# Patient Record
Sex: Female | Born: 1935 | Race: White | Hispanic: No | Smoking: Never smoker
Health system: Southern US, Community
[De-identification: ages and names within clinical notes are randomized; demographics above are authoritative.]

## PROBLEM LIST (undated history)

## (undated) DIAGNOSIS — E7800 Pure hypercholesterolemia, unspecified: Secondary | ICD-10-CM

## (undated) DIAGNOSIS — M5481 Occipital neuralgia: Secondary | ICD-10-CM

## (undated) DIAGNOSIS — K635 Polyp of colon: Secondary | ICD-10-CM

## (undated) DIAGNOSIS — M1990 Unspecified osteoarthritis, unspecified site: Secondary | ICD-10-CM

## (undated) DIAGNOSIS — E559 Vitamin D deficiency, unspecified: Secondary | ICD-10-CM

## (undated) DIAGNOSIS — I10 Essential (primary) hypertension: Secondary | ICD-10-CM

## (undated) HISTORY — DX: Occipital neuralgia: M54.81

## (undated) HISTORY — DX: Unspecified osteoarthritis, unspecified site: M19.90

## (undated) HISTORY — DX: Vitamin D deficiency, unspecified: E55.9

## (undated) HISTORY — DX: Essential (primary) hypertension: I10

## (undated) HISTORY — PX: OTHER SURGICAL HISTORY: SHX169

## (undated) HISTORY — PX: DILATION AND CURETTAGE OF UTERUS: SHX78

## (undated) HISTORY — DX: Pure hypercholesterolemia, unspecified: E78.00

## (undated) HISTORY — DX: Polyp of colon: K63.5

---

## 1998-03-09 ENCOUNTER — Ambulatory Visit (HOSPITAL_COMMUNITY): Admission: RE | Admit: 1998-03-09 | Discharge: 1998-03-09 | Payer: Self-pay | Admitting: Geriatric Medicine

## 2002-08-18 ENCOUNTER — Encounter: Payer: Self-pay | Admitting: Geriatric Medicine

## 2002-08-18 ENCOUNTER — Ambulatory Visit (HOSPITAL_COMMUNITY): Admission: RE | Admit: 2002-08-18 | Discharge: 2002-08-18 | Payer: Self-pay | Admitting: Geriatric Medicine

## 2002-08-27 ENCOUNTER — Ambulatory Visit (HOSPITAL_BASED_OUTPATIENT_CLINIC_OR_DEPARTMENT_OTHER): Admission: RE | Admit: 2002-08-27 | Discharge: 2002-08-27 | Payer: Self-pay | Admitting: Surgery

## 2002-08-27 ENCOUNTER — Encounter (INDEPENDENT_AMBULATORY_CARE_PROVIDER_SITE_OTHER): Payer: Self-pay | Admitting: *Deleted

## 2003-10-17 ENCOUNTER — Ambulatory Visit (HOSPITAL_COMMUNITY): Admission: RE | Admit: 2003-10-17 | Discharge: 2003-10-17 | Payer: Self-pay | Admitting: Neurosurgery

## 2003-11-19 ENCOUNTER — Encounter: Admission: RE | Admit: 2003-11-19 | Discharge: 2004-01-14 | Payer: Self-pay | Admitting: Otolaryngology

## 2004-05-18 ENCOUNTER — Ambulatory Visit (HOSPITAL_COMMUNITY): Admission: RE | Admit: 2004-05-18 | Discharge: 2004-05-18 | Payer: Self-pay | Admitting: Gastroenterology

## 2004-05-18 ENCOUNTER — Encounter (INDEPENDENT_AMBULATORY_CARE_PROVIDER_SITE_OTHER): Payer: Self-pay | Admitting: Specialist

## 2004-11-16 ENCOUNTER — Ambulatory Visit (HOSPITAL_COMMUNITY): Admission: RE | Admit: 2004-11-16 | Discharge: 2004-11-16 | Payer: Self-pay | Admitting: Neurosurgery

## 2005-11-23 ENCOUNTER — Ambulatory Visit (HOSPITAL_COMMUNITY): Admission: RE | Admit: 2005-11-23 | Discharge: 2005-11-23 | Payer: Self-pay | Admitting: Neurosurgery

## 2006-12-03 ENCOUNTER — Ambulatory Visit (HOSPITAL_COMMUNITY): Admission: RE | Admit: 2006-12-03 | Discharge: 2006-12-03 | Payer: Self-pay | Admitting: Neurosurgery

## 2007-11-21 ENCOUNTER — Encounter: Admission: RE | Admit: 2007-11-21 | Discharge: 2007-11-21 | Payer: Self-pay | Admitting: Neurosurgery

## 2010-05-08 ENCOUNTER — Encounter: Payer: Self-pay | Admitting: Neurosurgery

## 2010-09-02 NOTE — Op Note (Signed)
NAMEVERONIA, Marquez NO.:  [id number]   MEDICAL RECORD NO.:  [id number]          PATIENT TYPE:  AMB   LOCATION:  ENDO                         FACILITY:  [facility]   PHYSICIAN:  Danise Edge, M.D.   DATE OF BIRTH:  1931-08-24   DATE OF PROCEDURE:  05/18/2004  DATE OF DISCHARGE:                                 OPERATIVE REPORT   PROCEDURES:  1.  Colonoscopy.  2.  Polypectomy.   PROCEDURE INDICATION:  Ms. [name] is a 77 year old female born on  03-Aug-1931.  Ms. [name] is scheduled to undergo a screening  colonoscopy with polypectomy to prevent colon cancer.   ENDOSCOPIST:  Danise Edge, M.D.   PREMEDICATION:  Versed 4 mg and Demerol 70 mg.   DESCRIPTION OF PROCEDURE:  After obtaining informed consent, Ms. Risk was  placed in the left lateral decubitus position.  I administered intravenous  Demerol and intravenous Versed to achieve conscious sedation for the  procedure.  The patient's blood pressure, oxygen saturation and cardiac  rhythm were monitored throughout the procedure and documented in the medical  record.   Anal inspection and digital rectal exam were normal.  The Olympus adjustable  pediatric colonoscope was introduced into the rectum and advanced to the  cecum.  Colonic preparation for the exam today was excellent.   Rectum:  Normal.   Sigmoid colon and descending:  At 45 cm from the anal verge a 2 mm sessile  polyp was lifted by submucosal saline injection and removed with the  electrocautery snare.   Splenic flexure:  Normal.   Transverse colon:  Normal.   Hepatic flexure:  Normal.   Ascending colon:  Normal.   Cecum and ileocecal valve:  Normal.   ASSESSMENT:  A small polyp was removed from the sigmoid colon at 45 cm from  the anal verge; otherwise normal proctocolonoscopy to the cecum.      MJ/MEDQ  D:  05/18/2004  T:  05/18/2004  Job:  [id number]   cc:   Hal T. Stoneking, M.D.  [address] [place], [place] [address]  Fax: [phone]

## 2010-09-02 NOTE — Op Note (Signed)
NAME:  HOLLIS, OH                       ACCOUNT NO.:  [id number]   MEDICAL RECORD NO.:  [id number]                   PATIENT TYPE:  OUT   LOCATION:  XRAY                                 FACILITY:  [facility]   PHYSICIAN:  Currie Paris, M.D.           DATE OF BIRTH:  1931/08/08   DATE OF PROCEDURE:  08/27/2002  DATE OF DISCHARGE:  08/18/2002                                 OPERATIVE REPORT   OFFICE MEDICAL RECORD NUMBER:  [id number]   PREOPERATIVE DIAGNOSIS:  Calcifications left breast with atypical  hyperplasia on core biopsy.   POSTOPERATIVE DIAGNOSIS:  Calcifications left breast with atypical  hyperplasia on core biopsy.   OPERATION:  Needle guided excision left breast calcifications.   SURGEON:  Currie Paris, M.D.   ANESTHESIA:  MAC.   CLINICAL HISTORY:  This patient is a 77 year old with some calcifications  seen and core biopsy having been done showing some atypical hyperplasia and  CAPPS.  It was decided to do a wider excision to rule out an underlying or  adjacent malignancy.   DESCRIPTION OF PROCEDURE:  The patient seen in the holding area and had no  further questions.  Dr. Isabell Jarvis had already placed a guide wire.  The guide  wire entered superiorly and tracked inferiorly and laterally.   The patient was taken to the operating room and after satisfactory IV  sedation, the breast was prepped and draped.  I injected 1% Xylocaine with  0.5% Marcaine for local.  The guide wire was retracted somewhat inferiorly,  so I made an incision about 1.5 cm below the entry site of the guide wire  which was likewise below the core biopsy entry site.  I divided about 1 cm  of breast and subcutaneous tissue and then identified the guide wire.  Using  retractors and cauterizes, I started coming around the guide wire going  posterior to it basically straight down towards the breast and around both  medially and laterally and then once I had it loosened up I was able  to  grasp the tissue around it and continuing incision going inferiorly and the  axillary tip of the guide wire and the area in question was fairly close to  the nipple areolar complex and I in fact got under the nipple somewhat in  taking this tissue out.  I did see what looked like either an old biopsy  cavity or perhaps an old chronically inflamed cyst which I marked with  suture.   Once the specimen was out, it was sent for mammogram and Dr. Isabell Jarvis  reported that it appeared that we had the area well encompassed and  apparently in the middle of the specimen.   While waiting for that, we went ahead irrigated, put in more local to make  sure there was good pain relief and used the cautery to make sure we had  complete hemostasis.  Once everything appeared to be  dry we closed the  subcutaneous with some 3-0 Vicryl and the skin with 4-0 Monocryl leaving a  little bit of the cavity since thought that trying to close anything deeper  would cause significant breast deformity.   The patient tolerated the procedure well.  There were no operative  complications.  All counts were correct.                                               Currie Paris, M.D.    CJS/MEDQ  D:  08/27/2002  T:  08/28/2002  Job:  [id number]   cc:   Hal T. Stoneking, M.D.  [address]  [place], [place] [address]  Fax: [phone]   Zella Richer, M.D.

## 2011-02-17 ENCOUNTER — Ambulatory Visit (HOSPITAL_BASED_OUTPATIENT_CLINIC_OR_DEPARTMENT_OTHER)
Admission: RE | Admit: 2011-02-17 | Discharge: 2011-02-17 | Disposition: A | Discharge status: Home / Self Care | Payer: Medicare Other | Source: Ambulatory Visit | Attending: Otolaryngology | Admitting: Otolaryngology

## 2011-02-17 ENCOUNTER — Other Ambulatory Visit: Payer: Self-pay | Admitting: Otolaryngology

## 2011-02-17 DIAGNOSIS — B079 Viral wart, unspecified: Secondary | ICD-10-CM | POA: Insufficient documentation

## 2011-02-23 NOTE — Op Note (Signed)
  NAMEKATHERYN, Cynthia Marquez NO.:  [id number]  MEDICAL RECORD NO.:  [id number]  LOCATION:                                 FACILITY:  PHYSICIAN:  Kristine Garbe. Ezzard Standing, M.D.DATE OF BIRTH:  [birth year]  DATE OF PROCEDURE:  02/17/2011 DATE OF DISCHARGE:                              OPERATIVE REPORT   PREOPERATIVE DIAGNOSIS:  Left nasal lesion.  POSTOPERATIVE DIAGNOSIS:  Left nasal lesion.  OPERATION:  Excision and cauterization of left nasal lesion.  SURGEON:  Kristine Garbe. Ezzard Standing, MD  ANESTHESIA:  Local 1% Xylocaine with 100,000 epinephrine.  COMPLICATIONS:  None.  BRIEF CLINICAL NOTE:  Cynthia Marquez is a 77 year old female, who has noticed a nodule in the left nose that is obstructing her breathing a little bit.  It has been present for several months now.  On examination, the patient has an exophytic papillary nodule involving the left lateral intranasal wall in the nostril area consistent with a probable verrucose.  This extends into the nasal cavity body close to the septum.  She is taken to the operating room at this time for excision under local anesthetic.  DESCRIPTION OF PROCEDURE:  The nose was injected with 1% Xylocaine with epinephrine.  After local anesthetic, the lesion was grasped, was excised with scissors, and sent to pathology.  The small defect was cauterized using silver nitrate and a cotton ball packing.  [name] tolerated this well.  She will leave the cotton ball packing in place for 30 minutes and then remove it.  We will her follow up in my office in 7-10 days for recheck.          ______________________________ Kristine Garbe. Ezzard Standing, M.D.     CEN/MEDQ  D:  02/17/2011  T:  02/17/2011  Job:  [id number]  Electronically Signed by Dillard Cannon M.D. on 02/23/2011 10:11:20 AM

## 2013-05-06 ENCOUNTER — Ambulatory Visit
Admission: RE | Admit: 2013-05-06 | Discharge: 2013-05-06 | Disposition: A | Discharge status: Home / Self Care | Payer: Medicare Other | Source: Ambulatory Visit | Attending: Geriatric Medicine | Admitting: Geriatric Medicine

## 2013-05-06 ENCOUNTER — Other Ambulatory Visit: Payer: Self-pay | Admitting: Geriatric Medicine

## 2013-05-06 DIAGNOSIS — R918 Other nonspecific abnormal finding of lung field: Secondary | ICD-10-CM

## 2013-05-12 ENCOUNTER — Other Ambulatory Visit: Payer: Self-pay | Admitting: Geriatric Medicine

## 2013-05-12 ENCOUNTER — Encounter: Payer: Self-pay | Admitting: *Deleted

## 2013-05-12 ENCOUNTER — Encounter: Payer: Self-pay | Admitting: Cardiology

## 2013-05-12 DIAGNOSIS — K635 Polyp of colon: Secondary | ICD-10-CM | POA: Insufficient documentation

## 2013-05-12 DIAGNOSIS — M1990 Unspecified osteoarthritis, unspecified site: Secondary | ICD-10-CM | POA: Insufficient documentation

## 2013-05-12 DIAGNOSIS — E559 Vitamin D deficiency, unspecified: Secondary | ICD-10-CM | POA: Insufficient documentation

## 2013-05-12 DIAGNOSIS — M5481 Occipital neuralgia: Secondary | ICD-10-CM | POA: Insufficient documentation

## 2013-05-12 DIAGNOSIS — I10 Essential (primary) hypertension: Secondary | ICD-10-CM | POA: Insufficient documentation

## 2013-05-12 DIAGNOSIS — E7800 Pure hypercholesterolemia, unspecified: Secondary | ICD-10-CM | POA: Insufficient documentation

## 2013-05-12 DIAGNOSIS — R9389 Abnormal findings on diagnostic imaging of other specified body structures: Secondary | ICD-10-CM

## 2013-05-14 ENCOUNTER — Other Ambulatory Visit: Payer: Medicare Other

## 2013-05-20 ENCOUNTER — Encounter: Payer: Self-pay | Admitting: Cardiology

## 2013-05-20 ENCOUNTER — Encounter (INDEPENDENT_AMBULATORY_CARE_PROVIDER_SITE_OTHER): Payer: Self-pay

## 2013-05-20 ENCOUNTER — Ambulatory Visit (INDEPENDENT_AMBULATORY_CARE_PROVIDER_SITE_OTHER): Payer: Medicare Other | Admitting: Cardiology

## 2013-05-20 VITALS — BP 140/80 | HR 66 | Ht 65.0 in | Wt 139.0 lb

## 2013-05-20 DIAGNOSIS — R0600 Dyspnea, unspecified: Secondary | ICD-10-CM

## 2013-05-20 DIAGNOSIS — R0609 Other forms of dyspnea: Secondary | ICD-10-CM

## 2013-05-20 DIAGNOSIS — R079 Chest pain, unspecified: Secondary | ICD-10-CM

## 2013-05-20 DIAGNOSIS — I10 Essential (primary) hypertension: Secondary | ICD-10-CM

## 2013-05-20 DIAGNOSIS — R0989 Other specified symptoms and signs involving the circulatory and respiratory systems: Secondary | ICD-10-CM

## 2013-05-20 DIAGNOSIS — Z8249 Family history of ischemic heart disease and other diseases of the circulatory system: Secondary | ICD-10-CM

## 2013-05-20 NOTE — Progress Notes (Signed)
[address] [place], [place]  [address] Phone: [phone] Fax:  [phone]  Date:  05/20/2013   ID:  Cynthia Marquez, DOB [birth year], MRN [id number]  PCP:  No primary provider on file.   History of Present Illness: Cynthia Marquez is a 80 y.o. female here for evaluation of chest pain and shortness of breath at the request of Dr. Pete Glatter. Previous physician also hurting murmur. She also has been noticing chest discomfort during yard work and shortness of breath that seems to limit her ability. This is a new sensation for her. No wheezing, no cough currently. Chest x-ray showed a left lower lobe nodule. Dr. Pete Glatter will be following. No smoking history. 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 Her daughter is an Pensions consultant who works in [place]. Has a prior history of acoustic neuroma.  When she visits [place] son, ends up in ER. Thinks it is the cold. No problems in spring and fall.    Wt Readings from Last 3 Encounters:  05/20/13 139 lb (63.05 kg)     Past Medical History  Diagnosis Date  . HTN (hypertension)   . Hypercholesteremia   . Vitamin D deficiency   . Osteoarthrosis   . Occipital neuralgia   . Hypercholesteremia   . Colon polyp     Past Surgical History  Procedure Laterality Date  . Dilation and curettage of uterus    . Cataract surgery    . Polyp left nostril dr Ezzard Standing      Current Outpatient Prescriptions  Medication Sig Dispense Refill  . albuterol (PROAIR HFA) 108 (90 BASE) MCG/ACT inhaler Inhale 1 puff into the lungs every 6 (six) hours as needed for wheezing or shortness of breath.      . gabapentin (NEURONTIN) 300 MG capsule Take 300 mg by mouth 3 (three) times daily.      Marland Kitchen ibandronate (BONIVA) 150 MG tablet Take 150 mg by mouth every 30 (thirty) days. Take in the morning with a full glass of water, on an empty stomach, and do not take anything else by mouth or lie down for the next 30 min.      . meclizine (ANTIVERT) 12.5 MG  tablet Take 12.5 mg by mouth 3 (three) times daily as needed for dizziness.      . Multiple Vitamin (MULTIVITAMIN) capsule Take 1 capsule by mouth daily.      . vitamin C (ASCORBIC ACID) 500 MG tablet Take 500 mg by mouth daily.       No current facility-administered medications for this visit.    Allergies:   No Known Allergies  Social History:  The patient  reports that she has never smoked. She does not have any smokeless tobacco history on file.   Family History  Problem Relation Age of Onset  . Stroke Father   . Leukemia Mother   . Heart attack Sister   . Asthma Sister    Sister was 76 with MI.   ROS:  Please see the history of present illness.   No recent orthopnea, no PND, no syncope, no bleeding, no palpitations , no rashes   All other systems reviewed and negative.   PHYSICAL EXAM: VS:  BP 140/80  Pulse 66  Ht 5\' 5"  (1.651 m)  Wt 139 lb (63.05 kg)  BMI 23.13 kg/m2 Thin, in no acute distress HEENT: normal, [place]/AT, EOMI Neck: no JVD, normal carotid upstroke, no bruit Cardiac:  normal S1, S2;  RRR; no murmur Lungs:  clear to auscultation bilaterally, no wheezing, rhonchi or ralesPositive kyphosis Abd: soft, nontender, no hepatomegaly, no bruits Ext: no edema, 2+ distal pulses Skin: warm and dry GU: deferred Neuro: no focal abnormalities noted, AAO x 3  EKG:  Sinus rhythm rate 66 with no other significant abnormalities   ASSESSMENT AND PLAN:  1. Chest pain/dyspnea-given her age, sister with myocardial infarction, I would like to pursue pharmacologic stress test to exclude ischemia. 2. I do not currently appreciate a heart murmur. Emergency room physician [place] heard. May have been wheeze. 3. Hypertension-currently reasonably controlled. Continue to monitor.  Signed, Donato SchultzMark Comer Devins, MD [facility]  05/20/2013 3:53 PM

## 2013-05-20 NOTE — Patient Instructions (Addendum)
Your physician recommends that you continue on your current medications as directed. Please refer to the Current Medication list given to you today.  Your physician has requested that you have a lexiscan myoview. For further information please visit [email or web address]. Please follow instruction sheet, as given.  Your physician recommends that you follow-up as needed.

## 2013-05-21 ENCOUNTER — Ambulatory Visit
Admission: RE | Admit: 2013-05-21 | Discharge: 2013-05-21 | Disposition: A | Discharge status: Home / Self Care | Payer: Medicare Other | Source: Ambulatory Visit | Attending: Geriatric Medicine | Admitting: Geriatric Medicine

## 2013-05-21 DIAGNOSIS — R9389 Abnormal findings on diagnostic imaging of other specified body structures: Secondary | ICD-10-CM

## 2013-05-21 MED ORDER — IOHEXOL 300 MG/ML  SOLN
75.0000 mL | Freq: Once | INTRAMUSCULAR | Status: AC | PRN
  Administered 2013-05-21: 75 mL via INTRAVENOUS

## 2013-06-02 ENCOUNTER — Ambulatory Visit (HOSPITAL_COMMUNITY): Payer: Medicare Other | Attending: Interventional Cardiology | Admitting: Radiology

## 2013-06-02 ENCOUNTER — Encounter: Payer: Self-pay | Admitting: Cardiovascular Disease

## 2013-06-02 VITALS — BP 147/70 | Ht 65.0 in | Wt 139.0 lb

## 2013-06-02 DIAGNOSIS — Z8249 Family history of ischemic heart disease and other diseases of the circulatory system: Secondary | ICD-10-CM | POA: Insufficient documentation

## 2013-06-02 DIAGNOSIS — R0600 Dyspnea, unspecified: Secondary | ICD-10-CM

## 2013-06-02 DIAGNOSIS — R0609 Other forms of dyspnea: Secondary | ICD-10-CM | POA: Insufficient documentation

## 2013-06-02 DIAGNOSIS — J45909 Unspecified asthma, uncomplicated: Secondary | ICD-10-CM | POA: Insufficient documentation

## 2013-06-02 DIAGNOSIS — R079 Chest pain, unspecified: Secondary | ICD-10-CM | POA: Insufficient documentation

## 2013-06-02 DIAGNOSIS — R0602 Shortness of breath: Secondary | ICD-10-CM | POA: Insufficient documentation

## 2013-06-02 DIAGNOSIS — R0989 Other specified symptoms and signs involving the circulatory and respiratory systems: Secondary | ICD-10-CM | POA: Insufficient documentation

## 2013-06-02 DIAGNOSIS — E785 Hyperlipidemia, unspecified: Secondary | ICD-10-CM | POA: Insufficient documentation

## 2013-06-02 DIAGNOSIS — I10 Essential (primary) hypertension: Secondary | ICD-10-CM | POA: Insufficient documentation

## 2013-06-02 DIAGNOSIS — R0789 Other chest pain: Secondary | ICD-10-CM

## 2013-06-02 MED ORDER — REGADENOSON 0.4 MG/5ML IV SOLN
0.4000 mg | Freq: Once | INTRAVENOUS | Status: AC
  Administered 2013-06-02: 0.4 mg via INTRAVENOUS

## 2013-06-02 MED ORDER — TECHNETIUM TC 99M SESTAMIBI GENERIC - CARDIOLITE
30.0000 | Freq: Once | INTRAVENOUS | Status: AC | PRN
  Administered 2013-06-02: 30 via INTRAVENOUS

## 2013-06-02 MED ORDER — TECHNETIUM TC 99M SESTAMIBI GENERIC - CARDIOLITE
10.0000 | Freq: Once | INTRAVENOUS | Status: AC | PRN
  Administered 2013-06-02: 10 via INTRAVENOUS

## 2013-06-02 NOTE — Progress Notes (Signed)
[facility] SITE 3 NUCLEAR MED [address] , [place] [address] [phone]    Cardiology Nuclear Med Study  Cynthia PotashJeanette C Marquez is a 80 y.o. female     MRN : [id number]     DOB: 02/16/1932  Procedure Date: 06/02/2013  Nuclear Med Background Indication for Stress Test:  Evaluation for Ischemia History:  Asthma and No Cardiac History Cardiac Risk Factors: Family History - CAD, Hypertension and Lipids  Symptoms:  Chest Pain, DOE and SOB   Nuclear Pre-Procedure Caffeine/Decaff Intake:  None NPO After: 8:00pm   Lungs:  clear O2 Sat: 98% on room air. IV 0.9% NS with Angio Cath:  22g  IV Site: R Antecubital  IV Started by:  Bonnita LevanJackie Smith, RN  Chest Size (in):  36 Cup Size: B  Height: 5\' 5"  (1.651 m)  Weight:  139 lb (63.05 kg)  BMI:  Body mass index is 23.13 kg/(m^2). Tech Comments:  N/A    Nuclear Med Study 1 or 2 day study: 1 day  Stress Test Type:  Lexiscan  Reading MD: N/A  Order Authorizing Provider:  Donato SchultzMark Skains, MD  Resting Radionuclide: Technetium 3151m Sestamibi  Resting Radionuclide Dose: 11.0 mCi   Stress Radionuclide:  Technetium 3551m Sestamibi  Stress Radionuclide Dose: 33.0 mCi           Stress Protocol Rest HR: 68 Stress HR: 100  Rest BP: 147/70 Stress BP: 145/79  Exercise Time (min): n/a METS: n/a   Predicted Max HR: 141 bpm % Max HR: 70.92 bpm Rate Pressure Product: [id number]   Dose of Adenosine (mg):  n/a Dose of Lexiscan: 0.4 mg  Dose of Atropine (mg): n/a Dose of Dobutamine: n/a mcg/kg/min (at max HR)  Stress Test Technologist: Milana NaSabrina Williams, EMT-P  Nuclear Technologist:  Doyne Keelonya Yount, CNMT     Rest Procedure:  Myocardial perfusion imaging was performed at rest 45 minutes following the intravenous administration of Technetium 2551m Sestamibi. Rest ECG: NSR - Normal EKG  Stress Procedure:  The patient received IV Lexiscan 0.4 mg over 15-seconds.  Technetium 1251m Sestamibi injected at 30-seconds. This patient was sob and had a headache with  the Lexiscan injection. Quantitative spect images were obtained after a 45 minute delay. Stress ECG: No significant change from baseline ECG  QPS Raw Data Images:  Normal; no motion artifact; normal heart/lung ratio. Stress Images:  Normal homogeneous uptake in all areas of the myocardium. Rest Images:  Normal homogeneous uptake in all areas of the myocardium. Subtraction (SDS):  No evidence of ischemia. Transient Ischemic Dilatation (Normal <1.22):  0.96 Lung/Heart Ratio (Normal <0.45):  0.25  Quantitative Gated Spect Images QGS EDV:  51 ml QGS ESV:  12 ml  Impression Exercise Capacity:  Lexiscan with no exercise. BP Response:  Normal blood pressure response. Clinical Symptoms:  There is dyspnea. ECG Impression:  No significant ST segment change suggestive of ischemia. Comparison with Prior Nuclear Study: No previous nuclear study performed  Overall Impression:  Normal stress nuclear study.  LV Ejection Fraction: 76%.  LV Wall Motion:  NL LV Function; NL Wall Motion  [name], [name], [name] 06/02/2013

## 2014-05-22 ENCOUNTER — Other Ambulatory Visit: Payer: Self-pay | Admitting: Geriatric Medicine

## 2014-05-22 DIAGNOSIS — R918 Other nonspecific abnormal finding of lung field: Secondary | ICD-10-CM

## 2014-05-28 ENCOUNTER — Ambulatory Visit
Admission: RE | Admit: 2014-05-28 | Discharge: 2014-05-28 | Disposition: A | Discharge status: Home / Self Care | Payer: Self-pay | Source: Ambulatory Visit | Attending: Geriatric Medicine | Admitting: Geriatric Medicine

## 2014-05-28 DIAGNOSIS — R918 Other nonspecific abnormal finding of lung field: Secondary | ICD-10-CM

## 2015-05-27 ENCOUNTER — Other Ambulatory Visit: Payer: Self-pay | Admitting: Geriatric Medicine

## 2015-05-27 DIAGNOSIS — R918 Other nonspecific abnormal finding of lung field: Secondary | ICD-10-CM

## 2015-06-01 ENCOUNTER — Ambulatory Visit
Admission: RE | Admit: 2015-06-01 | Discharge: 2015-06-01 | Disposition: A | Discharge status: Home / Self Care | Payer: Medicare Other | Source: Ambulatory Visit | Attending: Geriatric Medicine | Admitting: Geriatric Medicine

## 2015-06-01 DIAGNOSIS — R918 Other nonspecific abnormal finding of lung field: Secondary | ICD-10-CM

## 2016-08-13 IMAGING — CT CT CHEST W/O CM
3 of 4 series · 17 of 30 positions shown, 19 images · non-contrast
Comparison: 05/28/2014

CLINICAL DATA: Follow-up lung nodules

EXAM:
CT CHEST WITHOUT CONTRAST
TECHNIQUE: Multidetector CT imaging of the chest was performed following the
standard protocol without IV contrast.

[Series 3: chest w/o · axial · non-contrast · 0.56mm/px · z∈[-238,-38]mm · 5 of 60 slices shown, 7 images]
[im 10/60  mediastinal]
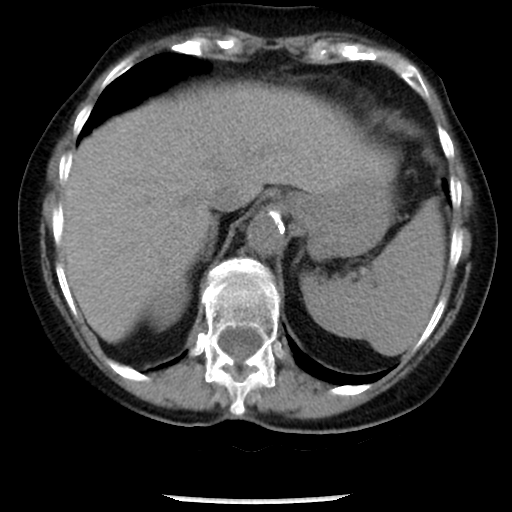
[im 10/60  lung]
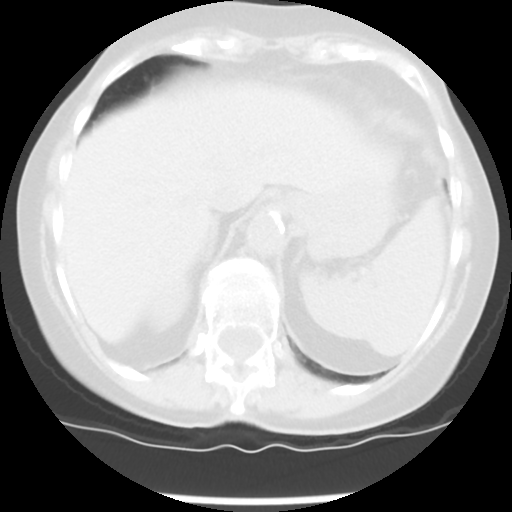
[im 20/60  lung]
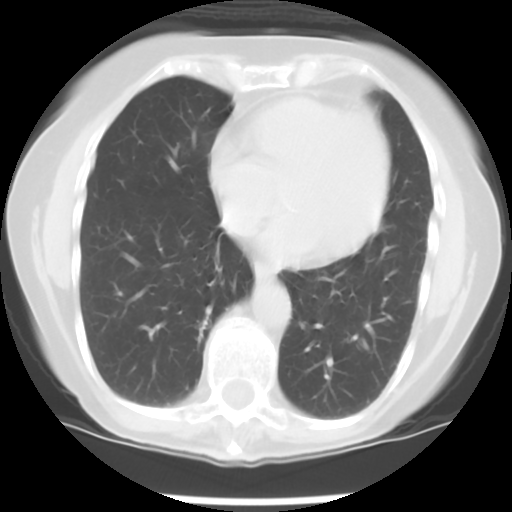
[im 30/60  lung]
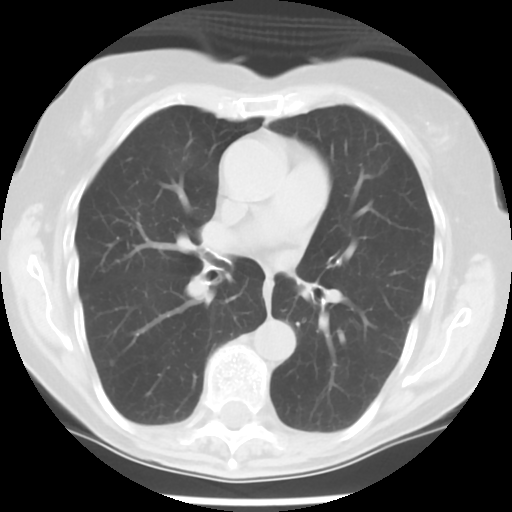
[im 40/60  lung]
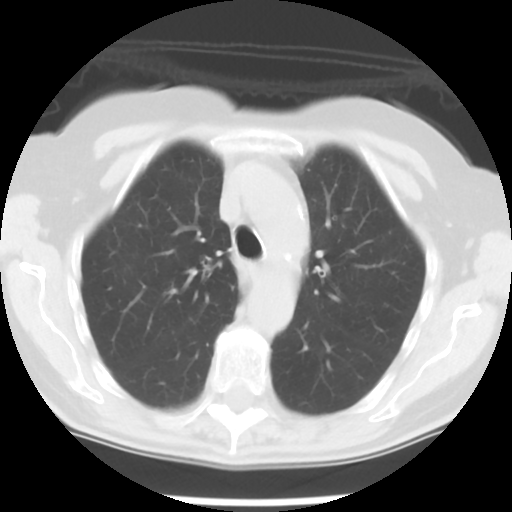
[im 50/60  mediastinal]
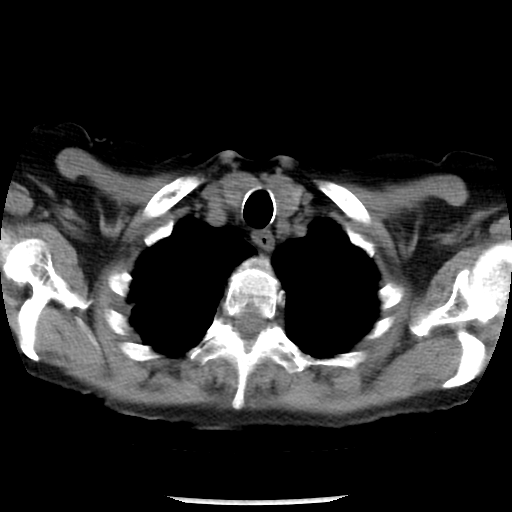
[im 50/60  lung]
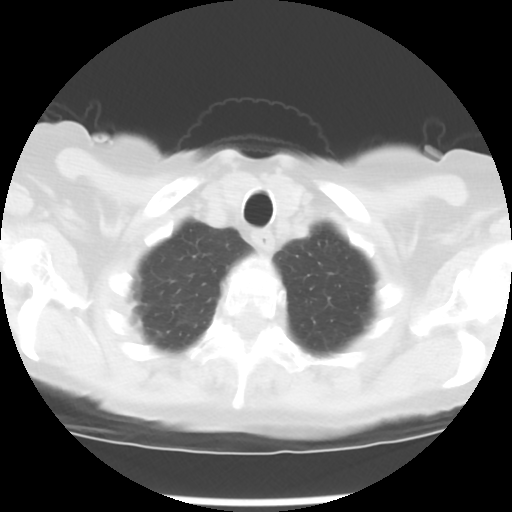

[Series 4: lung windows · axial · 0.56mm/px · z∈[-238,-38]mm · 5 of 60 slices shown]
[im 10/60  lung]
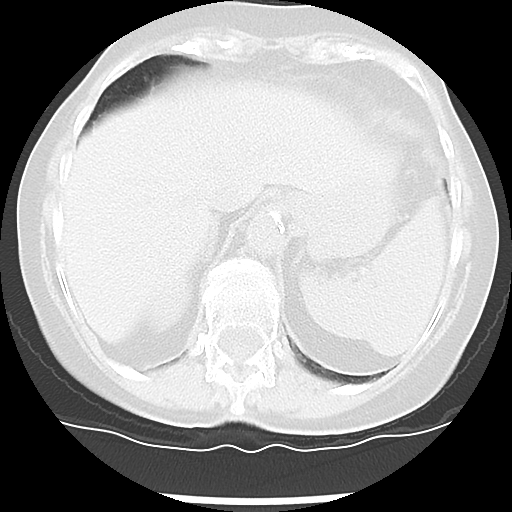
[im 20/60  lung]
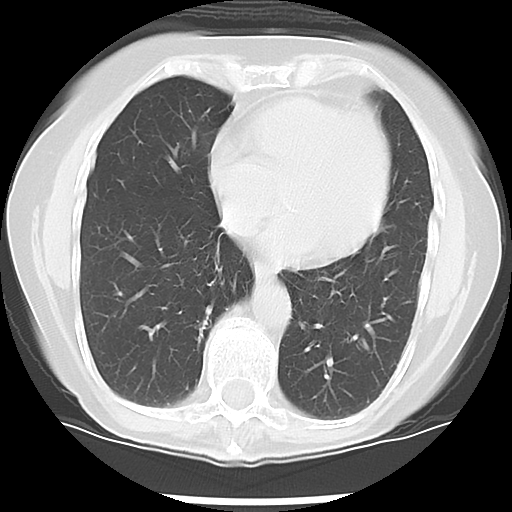
[im 30/60  lung]
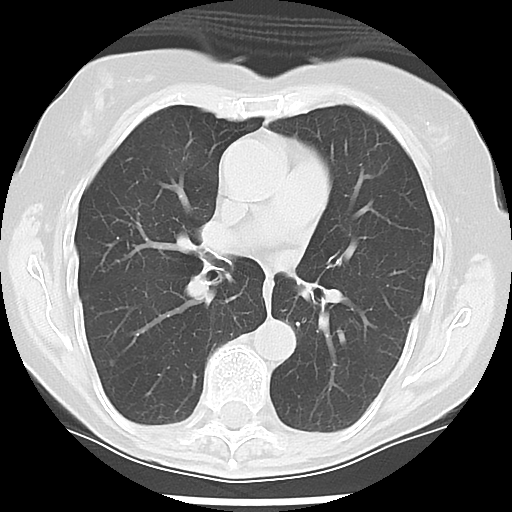
[im 40/60  lung]
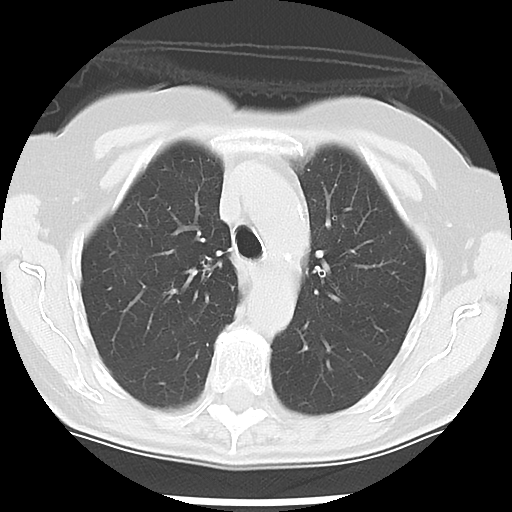
[im 50/60  lung]
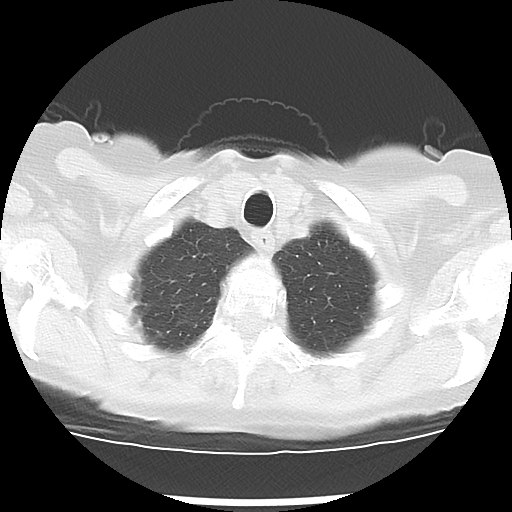

[Series 602: sagittal body · sagittal · 0.58mm/px · 7 of 116 slices shown]
[im 9/116  mediastinal]
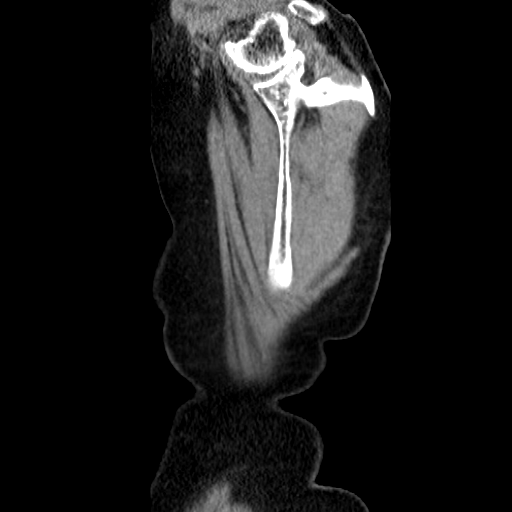
[im 27/116  mediastinal]
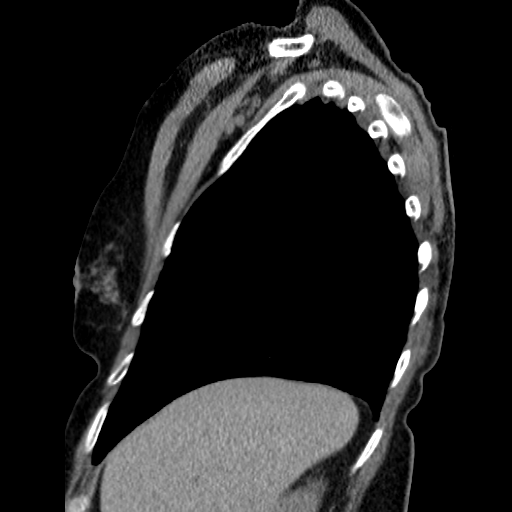
[im 36/116  mediastinal]
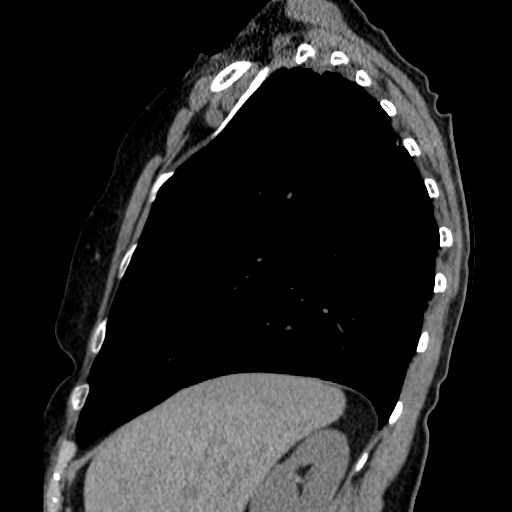
[im 54/116  mediastinal]
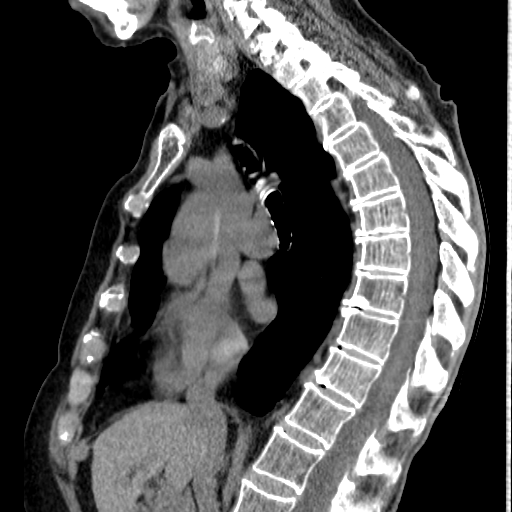
[im 62/116  mediastinal]
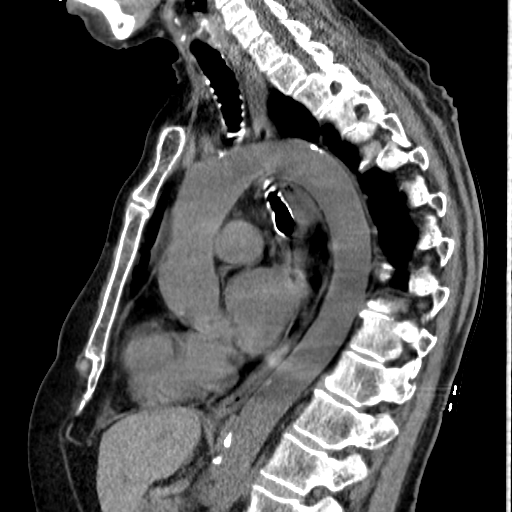
[im 80/116  mediastinal]
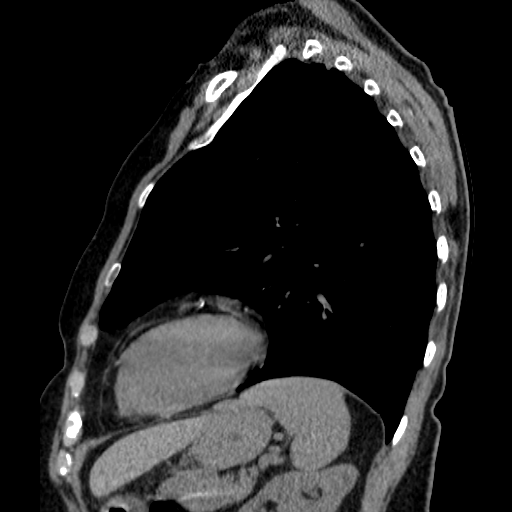
[im 89/116  mediastinal]
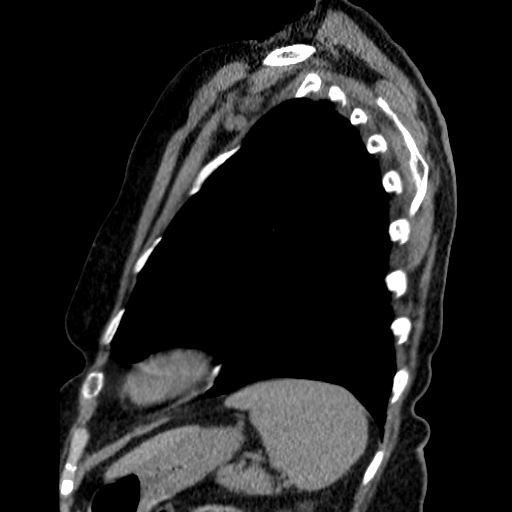

[17 of 30 positions shown; findings below may reference images not displayed]

FINDINGS: Multiple bilateral small nodules are stable. The largest nodule is
in the left upper lobe measuring 4 mm on image 16. Nodules are seen
in the right upper lobe on image 21, right upper lobe on image 30,
right lower lobe on image 32, right lower lobe on image 34, right
lower lobe on image 35, right lower lobe on image 43, and left upper
lobe on image 16. No definite new nodules.

No obvious abnormal mediastinal adenopathy. No pericardial effusion.
Mild coronary artery calcifications. Minimal central aortic valve
calcification.

Thyroid gland is heterogeneous.

Hyperaeration.

No acute bony deformity. Kyphosis is present without obvious
vertebral fracture. There is some wedging of the mid thoracic
vertebral bodies which have a chronic appearance.
IMPRESSION: Small bilateral pulmonary nodules are stable supporting benign
etiology.

## 2021-04-26 DIAGNOSIS — H5203 Hypermetropia, bilateral: Secondary | ICD-10-CM | POA: Diagnosis not present

## 2021-06-21 DIAGNOSIS — D333 Benign neoplasm of cranial nerves: Secondary | ICD-10-CM | POA: Diagnosis not present

## 2021-06-21 DIAGNOSIS — H903 Sensorineural hearing loss, bilateral: Secondary | ICD-10-CM | POA: Diagnosis not present

## 2021-06-30 DIAGNOSIS — M810 Age-related osteoporosis without current pathological fracture: Secondary | ICD-10-CM | POA: Diagnosis not present

## 2021-08-30 DIAGNOSIS — Z1331 Encounter for screening for depression: Secondary | ICD-10-CM | POA: Diagnosis not present

## 2021-08-30 DIAGNOSIS — M810 Age-related osteoporosis without current pathological fracture: Secondary | ICD-10-CM | POA: Diagnosis not present

## 2021-08-30 DIAGNOSIS — Z0000 Encounter for general adult medical examination without abnormal findings: Secondary | ICD-10-CM | POA: Diagnosis not present

## 2021-08-30 DIAGNOSIS — Z79899 Other long term (current) drug therapy: Secondary | ICD-10-CM | POA: Diagnosis not present

## 2021-08-30 DIAGNOSIS — E559 Vitamin D deficiency, unspecified: Secondary | ICD-10-CM | POA: Diagnosis not present

## 2021-10-03 DIAGNOSIS — E559 Vitamin D deficiency, unspecified: Secondary | ICD-10-CM | POA: Diagnosis not present

## 2022-01-09 DIAGNOSIS — M810 Age-related osteoporosis without current pathological fracture: Secondary | ICD-10-CM | POA: Diagnosis not present

## 2022-07-11 DIAGNOSIS — M810 Age-related osteoporosis without current pathological fracture: Secondary | ICD-10-CM | POA: Diagnosis not present

## 2022-09-05 DIAGNOSIS — M810 Age-related osteoporosis without current pathological fracture: Secondary | ICD-10-CM | POA: Diagnosis not present

## 2022-09-05 DIAGNOSIS — E559 Vitamin D deficiency, unspecified: Secondary | ICD-10-CM | POA: Diagnosis not present

## 2022-09-05 DIAGNOSIS — H903 Sensorineural hearing loss, bilateral: Secondary | ICD-10-CM | POA: Diagnosis not present

## 2022-09-05 DIAGNOSIS — Z79899 Other long term (current) drug therapy: Secondary | ICD-10-CM | POA: Diagnosis not present

## 2022-09-05 DIAGNOSIS — Z0000 Encounter for general adult medical examination without abnormal findings: Secondary | ICD-10-CM | POA: Diagnosis not present

## 2023-07-12 DIAGNOSIS — M810 Age-related osteoporosis without current pathological fracture: Secondary | ICD-10-CM | POA: Diagnosis not present

## 2023-07-17 DIAGNOSIS — M810 Age-related osteoporosis without current pathological fracture: Secondary | ICD-10-CM | POA: Diagnosis not present

## 2023-07-17 DIAGNOSIS — R030 Elevated blood-pressure reading, without diagnosis of hypertension: Secondary | ICD-10-CM | POA: Diagnosis not present

## 2023-07-17 DIAGNOSIS — E559 Vitamin D deficiency, unspecified: Secondary | ICD-10-CM | POA: Diagnosis not present

## 2023-09-13 DIAGNOSIS — E559 Vitamin D deficiency, unspecified: Secondary | ICD-10-CM | POA: Diagnosis not present

## 2023-09-13 DIAGNOSIS — I10 Essential (primary) hypertension: Secondary | ICD-10-CM | POA: Diagnosis not present

## 2023-09-13 DIAGNOSIS — M810 Age-related osteoporosis without current pathological fracture: Secondary | ICD-10-CM | POA: Diagnosis not present

## 2023-09-13 DIAGNOSIS — Z0000 Encounter for general adult medical examination without abnormal findings: Secondary | ICD-10-CM | POA: Diagnosis not present

## 2023-09-27 DIAGNOSIS — I10 Essential (primary) hypertension: Secondary | ICD-10-CM | POA: Diagnosis not present

## 2023-11-15 DIAGNOSIS — M810 Age-related osteoporosis without current pathological fracture: Secondary | ICD-10-CM | POA: Diagnosis not present

## 2023-12-16 DIAGNOSIS — M810 Age-related osteoporosis without current pathological fracture: Secondary | ICD-10-CM | POA: Diagnosis not present

## 2024-01-14 DIAGNOSIS — M818 Other osteoporosis without current pathological fracture: Secondary | ICD-10-CM | POA: Diagnosis not present

## 2024-01-15 DIAGNOSIS — M810 Age-related osteoporosis without current pathological fracture: Secondary | ICD-10-CM | POA: Diagnosis not present
